# Patient Record
Sex: Male | Born: 1950 | Race: Black or African American | Hispanic: No | Marital: Single | Smoking: Never smoker
Health system: Southern US, Community
[De-identification: ages and names within clinical notes are randomized; demographics above are authoritative.]

## PROBLEM LIST (undated history)

## (undated) DIAGNOSIS — D1720 Benign lipomatous neoplasm of skin and subcutaneous tissue of unspecified limb: Secondary | ICD-10-CM

## (undated) DIAGNOSIS — N529 Male erectile dysfunction, unspecified: Secondary | ICD-10-CM

## (undated) DIAGNOSIS — R7611 Nonspecific reaction to tuberculin skin test without active tuberculosis: Secondary | ICD-10-CM

## (undated) DIAGNOSIS — H269 Unspecified cataract: Secondary | ICD-10-CM

## (undated) DIAGNOSIS — K219 Gastro-esophageal reflux disease without esophagitis: Secondary | ICD-10-CM

## (undated) DIAGNOSIS — J45909 Unspecified asthma, uncomplicated: Secondary | ICD-10-CM

## (undated) DIAGNOSIS — I10 Essential (primary) hypertension: Secondary | ICD-10-CM

## (undated) DIAGNOSIS — G4700 Insomnia, unspecified: Secondary | ICD-10-CM

## (undated) DIAGNOSIS — N400 Enlarged prostate without lower urinary tract symptoms: Secondary | ICD-10-CM

## (undated) DIAGNOSIS — M19019 Primary osteoarthritis, unspecified shoulder: Secondary | ICD-10-CM

## (undated) HISTORY — DX: Benign lipomatous neoplasm of skin and subcutaneous tissue of unspecified limb: D17.20

## (undated) HISTORY — DX: Nonspecific reaction to tuberculin skin test without active tuberculosis: R76.11

## (undated) HISTORY — DX: Benign prostatic hyperplasia without lower urinary tract symptoms: N40.0

## (undated) HISTORY — DX: Unspecified cataract: H26.9

## (undated) HISTORY — DX: Insomnia, unspecified: G47.00

## (undated) HISTORY — DX: Unspecified asthma, uncomplicated: J45.909

## (undated) HISTORY — DX: Male erectile dysfunction, unspecified: N52.9

## (undated) HISTORY — DX: Gastro-esophageal reflux disease without esophagitis: K21.9

## (undated) HISTORY — DX: Primary osteoarthritis, unspecified shoulder: M19.019

## (undated) HISTORY — DX: Essential (primary) hypertension: I10

---

## 1998-03-23 ENCOUNTER — Encounter: Admission: RE | Admit: 1998-03-23 | Discharge: 1998-06-21 | Payer: Self-pay | Admitting: Orthopedic Surgery

## 1998-04-07 ENCOUNTER — Ambulatory Visit (HOSPITAL_COMMUNITY): Admission: RE | Admit: 1998-04-07 | Discharge: 1998-04-07 | Payer: Self-pay | Admitting: *Deleted

## 1999-04-11 ENCOUNTER — Encounter: Admission: RE | Admit: 1999-04-11 | Discharge: 1999-07-10 | Payer: Self-pay | Admitting: Sports Medicine

## 1999-04-20 ENCOUNTER — Ambulatory Visit (HOSPITAL_COMMUNITY): Admission: RE | Admit: 1999-04-20 | Discharge: 1999-04-20 | Payer: Self-pay | Admitting: *Deleted

## 1999-04-27 ENCOUNTER — Ambulatory Visit (HOSPITAL_COMMUNITY): Admission: RE | Admit: 1999-04-27 | Discharge: 1999-04-27 | Payer: Self-pay | Admitting: Sports Medicine

## 1999-04-27 ENCOUNTER — Encounter: Payer: Self-pay | Admitting: Sports Medicine

## 1999-05-11 ENCOUNTER — Encounter: Payer: Self-pay | Admitting: Neurosurgery

## 1999-05-11 ENCOUNTER — Ambulatory Visit (HOSPITAL_COMMUNITY): Admission: RE | Admit: 1999-05-11 | Discharge: 1999-05-11 | Payer: Self-pay | Admitting: Neurosurgery

## 2000-03-19 ENCOUNTER — Encounter: Payer: Self-pay | Admitting: *Deleted

## 2000-03-19 ENCOUNTER — Ambulatory Visit (HOSPITAL_COMMUNITY): Admission: RE | Admit: 2000-03-19 | Discharge: 2000-03-19 | Payer: Self-pay | Admitting: *Deleted

## 2001-11-24 ENCOUNTER — Encounter: Admission: RE | Admit: 2001-11-24 | Discharge: 2001-11-24 | Payer: Self-pay | Admitting: Internal Medicine

## 2001-11-24 ENCOUNTER — Encounter: Payer: Self-pay | Admitting: Internal Medicine

## 2001-12-07 ENCOUNTER — Ambulatory Visit (HOSPITAL_COMMUNITY): Admission: RE | Admit: 2001-12-07 | Discharge: 2001-12-07 | Payer: Self-pay | Admitting: Gastroenterology

## 2003-06-08 ENCOUNTER — Encounter: Payer: Self-pay | Admitting: Internal Medicine

## 2003-06-08 ENCOUNTER — Encounter: Admission: RE | Admit: 2003-06-08 | Discharge: 2003-06-08 | Payer: Self-pay | Admitting: Internal Medicine

## 2003-06-20 ENCOUNTER — Encounter: Payer: Self-pay | Admitting: Emergency Medicine

## 2003-06-20 ENCOUNTER — Emergency Department (HOSPITAL_COMMUNITY): Admission: EM | Admit: 2003-06-20 | Discharge: 2003-06-20 | Payer: Self-pay | Admitting: Emergency Medicine

## 2003-07-03 ENCOUNTER — Encounter: Admission: RE | Admit: 2003-07-03 | Discharge: 2003-07-03 | Payer: Self-pay | Admitting: Internal Medicine

## 2003-07-03 ENCOUNTER — Encounter: Payer: Self-pay | Admitting: Internal Medicine

## 2011-01-28 ENCOUNTER — Other Ambulatory Visit: Payer: Self-pay | Admitting: Orthopedic Surgery

## 2011-01-28 DIAGNOSIS — M25512 Pain in left shoulder: Secondary | ICD-10-CM

## 2011-01-30 ENCOUNTER — Ambulatory Visit
Admission: RE | Admit: 2011-01-30 | Discharge: 2011-01-30 | Disposition: A | Discharge status: Home / Self Care | Payer: BC Managed Care – PPO | Source: Ambulatory Visit | Attending: Orthopedic Surgery | Admitting: Orthopedic Surgery

## 2011-01-30 DIAGNOSIS — M25512 Pain in left shoulder: Secondary | ICD-10-CM

## 2017-07-29 ENCOUNTER — Other Ambulatory Visit (INDEPENDENT_AMBULATORY_CARE_PROVIDER_SITE_OTHER): Payer: BC Managed Care – PPO

## 2017-07-29 ENCOUNTER — Ambulatory Visit (INDEPENDENT_AMBULATORY_CARE_PROVIDER_SITE_OTHER): Payer: BC Managed Care – PPO | Admitting: Pulmonary Disease

## 2017-07-29 ENCOUNTER — Telehealth: Payer: Self-pay | Admitting: Pulmonary Disease

## 2017-07-29 ENCOUNTER — Encounter: Payer: Self-pay | Admitting: Pulmonary Disease

## 2017-07-29 DIAGNOSIS — J189 Pneumonia, unspecified organism: Secondary | ICD-10-CM | POA: Diagnosis not present

## 2017-07-29 DIAGNOSIS — J984 Other disorders of lung: Secondary | ICD-10-CM

## 2017-07-29 DIAGNOSIS — E876 Hypokalemia: Secondary | ICD-10-CM | POA: Diagnosis not present

## 2017-07-29 LAB — CBC WITH DIFFERENTIAL/PLATELET
BASOS PCT: 0.8 % (ref 0.0–3.0)
Basophils Absolute: 0.1 10*3/uL (ref 0.0–0.1)
Eosinophils Absolute: 0.2 10*3/uL (ref 0.0–0.7)
Eosinophils Relative: 2.6 % (ref 0.0–5.0)
HEMATOCRIT: 42.1 % (ref 39.0–52.0)
Hemoglobin: 14.1 g/dL (ref 13.0–17.0)
LYMPHS ABS: 1.8 10*3/uL (ref 0.7–4.0)
Lymphocytes Relative: 26.1 % (ref 12.0–46.0)
MCHC: 33.4 g/dL (ref 30.0–36.0)
MCV: 95.9 fl (ref 78.0–100.0)
Monocytes Absolute: 0.5 10*3/uL (ref 0.1–1.0)
Monocytes Relative: 8 % (ref 3.0–12.0)
NEUTROS ABS: 4.3 10*3/uL (ref 1.4–7.7)
Neutrophils Relative %: 62.5 % (ref 43.0–77.0)
PLATELETS: 215 10*3/uL (ref 150.0–400.0)
RBC: 4.39 Mil/uL (ref 4.22–5.81)
RDW: 13.2 % (ref 11.5–15.5)
WBC: 6.8 10*3/uL (ref 4.0–10.5)

## 2017-07-29 LAB — COMPREHENSIVE METABOLIC PANEL
ALK PHOS: 55 U/L (ref 39–117)
ALT: 18 U/L (ref 0–53)
AST: 19 U/L (ref 0–37)
Albumin: 3.9 g/dL (ref 3.5–5.2)
BILIRUBIN TOTAL: 0.4 mg/dL (ref 0.2–1.2)
BUN: 19 mg/dL (ref 6–23)
CALCIUM: 8.9 mg/dL (ref 8.4–10.5)
CO2: 31 meq/L (ref 19–32)
CREATININE: 1.33 mg/dL (ref 0.40–1.50)
Chloride: 103 mEq/L (ref 96–112)
GFR: 68.7 mL/min (ref >60.00)
Glucose, Bld: 101 mg/dL — ABNORMAL HIGH (ref 70–99)
Potassium: 3.2 mEq/L — ABNORMAL LOW (ref 3.5–5.1)
Sodium: 141 mEq/L (ref 135–145)
TOTAL PROTEIN: 7.4 g/dL (ref 6.0–8.3)

## 2017-07-29 MED ORDER — PROMETHAZINE-CODEINE 6.25-10 MG/5ML PO SYRP: 5.0000 mL | ORAL_SOLUTION | Freq: Four times a day (QID) | ORAL | 0 refills | Status: AC | PRN

## 2017-07-29 MED ORDER — LEVOFLOXACIN 500 MG PO TABS: 500.0000 mg | ORAL_TABLET | Freq: Every day | ORAL | 0 refills | Status: DC

## 2017-07-29 NOTE — Patient Instructions (Signed)
You have a large fluid-filled cavity in your left upper lung This may represent a cavitary pneumonia or lung abscess  Please bring me the CD to reviewed images of the CAT scan, based on that we will decide about bronchoscopy procedure  Take Levaquin 500 milligrams daily  (prescription for 10 more tablets after you are done with current prescription)  Blood work today  Prescription for promethazine codeine cough syrup 2.5- 77ml at bedtime for 10 days -use Delsym 5 mL daily in the daytime to avoid sleepiness

## 2017-07-29 NOTE — Telephone Encounter (Signed)
Disc has been located and given to RA.

## 2017-07-29 NOTE — Assessment & Plan Note (Signed)
a large fluid-filled cavity in your left upper lung This may represent a cavitary pneumonia or lung abscess or infected pneumatocele. Interestingly CT chest in 2004 did not show any evidence of pneumatocele that location  Please bring me the CD to reviewed images of the CAT scan, based on that we will decide about bronchoscopy procedure  Take Levaquin 500 milligrams daily  (prescription for 10 more tablets after you are done with current prescription)  Blood work today  Prescription for promethazine codeine cough syrup 2.5- 40ml at bedtime for 10 days -use Delsym 5 mL daily in the daytime to avoid sleepiness

## 2017-07-29 NOTE — Progress Notes (Signed)
Subjective:    Patient ID: Phillip Tyler, male    DOB: 01-Jun-1951, 66 y.o.   MRN: [id number]  HPI  66 year old never smoker, [place] origin, IT professor at [employer] presents for evaluation of abnormal imaging studies.  He reports cough of insidious onset for about 3 weeks, no preceding URI symptoms or sick contacts. This is worse and he lies down to sleep or with cold drinks, one drink seem to help. He also reports heaviness in his left upper chest, nonpleuritic and nonradiating and nonexertional. He travel to [place] for his daughter's marriage on 7/28 and was coughing all the way back. He saw his PCP on 8/4, chest x-ray suggested large thin-walled lucent lesion involving the left lung with air fluid level. He was given Levaquin for 10 days and a prednisone dosepak was also given a codeine-containing cough syrup which is very expensive and he was unable to fill.  CT chest was performed on 8/7 which showed large left upper lobe cavitary with an air-fluid level measuring [id number] cm it was most consistent with a pneumatocele containing fluid. A small left effusion was also noted. I have personally reviewed these images after obtaining CT scan on a desk  He is a hypertensive, asthma was diagnosed 4 years ago and uses a B-12 MDI very seldom, appears to be mild intermittent no clear triggers. He is a lifetime never smoker and denies alcohol use His last visit to [place] was in December 2017. He has lived in the [place] for 35 years  Review of his blood work from 04/2017  is normal Blood work obtained today shows no leukocytosis and hypokalemia with potassium of 3.2  Past Medical History:  Diagnosis Date  . Arthritis of shoulder   . Asthma   . BPH without urinary obstruction   . Cataract   . Erectile dysfunction   . Esophageal reflux   . Hypertension   . Insomnia   . Lipoma of axilla   . Positive PPD      No past surgical history on file.    No Known  Allergies    Social History   Social History  . Marital status: Single    Spouse name: N/A  . Number of children: N/A  . Years of education: N/A   Occupational History  . Not on file.   Social History Main Topics  . Smoking status: Never Smoker  . Smokeless tobacco: Never Used  . Alcohol use No  . Drug use: No  . Sexual activity: Not on file   Other Topics Concern  . Not on file   Social History Narrative  . No narrative on file      Family History  Problem Relation Age of Onset  . Arthritis Mother        Deceased at age 24      Review of Systems   Constitutional: negative for anorexia, fevers and sweats  Eyes: negative for irritation, redness and visual disturbance  Ears, nose, mouth, throat, and face: negative for earaches, epistaxis, nasal congestion and sore throat  Respiratory: negative for  dyspnea on exertion, sputum and wheezing  Cardiovascular: negative for , dyspnea, lower extremity edema, orthopnea, palpitations and syncope  Gastrointestinal: negative for abdominal pain, constipation, diarrhea, melena, nausea and vomiting  Genitourinary:negative for dysuria, frequency and hematuria  Hematologic/lymphatic: negative for bleeding, easy bruising and lymphadenopathy  Musculoskeletal:negative for arthralgias, muscle weakness and stiff joints  Neurological: negative for coordination problems, gait problems, headaches and weakness  Endocrine:  negative for diabetic symptoms including polydipsia, polyuria and weight loss     Objective:   Physical Exam  Gen. Pleasant, well-nourished, in no distress, normal affect ENT - no lesions, no post nasal drip Neck: No JVD, no thyromegaly, no carotid bruits Lungs: no use of accessory muscles, no dullness to percussion, clear without rales or rhonchi , no egophony Cardiovascular: Rhythm regular, heart sounds  normal, no murmurs or gallops, no peripheral edema Abdomen: soft and non-tender, no hepatosplenomegaly, BS  normal. Musculoskeletal: No deformities, no cyanosis or clubbing Neuro:  alert, non focal        Assessment & Plan:

## 2017-07-30 DIAGNOSIS — E876 Hypokalemia: Secondary | ICD-10-CM | POA: Insufficient documentation

## 2017-07-30 NOTE — Assessment & Plan Note (Signed)
Replete with potassium chloride 40 mEq daily for 2 days

## 2017-07-31 ENCOUNTER — Other Ambulatory Visit: Payer: Self-pay | Admitting: *Deleted

## 2017-07-31 DIAGNOSIS — J984 Other disorders of lung: Principal | ICD-10-CM

## 2017-07-31 DIAGNOSIS — J189 Pneumonia, unspecified organism: Secondary | ICD-10-CM

## 2017-07-31 MED ORDER — LEVOFLOXACIN 500 MG PO TABS: 500.0000 mg | ORAL_TABLET | Freq: Every day | ORAL | 0 refills | Status: DC

## 2017-07-31 MED ORDER — POTASSIUM CHLORIDE ER 10 MEQ PO TBCR: 40.0000 meq | EXTENDED_RELEASE_TABLET | Freq: Every day | ORAL | 0 refills | Status: AC

## 2017-08-05 ENCOUNTER — Ambulatory Visit (INDEPENDENT_AMBULATORY_CARE_PROVIDER_SITE_OTHER)
Admission: RE | Admit: 2017-08-05 | Discharge: 2017-08-05 | Disposition: A | Discharge status: Home / Self Care | Payer: BC Managed Care – PPO | Source: Ambulatory Visit | Attending: Pulmonary Disease | Admitting: Pulmonary Disease

## 2017-08-05 ENCOUNTER — Other Ambulatory Visit: Payer: BC Managed Care – PPO

## 2017-08-05 DIAGNOSIS — J189 Pneumonia, unspecified organism: Secondary | ICD-10-CM

## 2017-08-05 DIAGNOSIS — J984 Other disorders of lung: Secondary | ICD-10-CM

## 2017-08-08 ENCOUNTER — Telehealth: Payer: Self-pay | Admitting: Pulmonary Disease

## 2017-08-08 DIAGNOSIS — J984 Other disorders of lung: Secondary | ICD-10-CM

## 2017-08-08 NOTE — Telephone Encounter (Signed)
Notes recorded by Randa Spike, CMA on 08/08/2017 at 3:39 PM EDT lmtcb x1 for pt. 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 States he has Levaquin #14 1 tablet QD at home to take and appt had already been set up with TP on 08-20-17 at 9:15am. Pt will arrive around 8:50am for CXR (STAT) prior to appt. CXR has been ordered. Nothing more needed at this time.

## 2017-08-12 ENCOUNTER — Telehealth: Payer: Self-pay | Admitting: Pulmonary Disease

## 2017-08-12 DIAGNOSIS — J984 Other disorders of lung: Secondary | ICD-10-CM

## 2017-08-12 NOTE — Telephone Encounter (Signed)
lmtcb X1 for pt  

## 2017-08-13 NOTE — Telephone Encounter (Signed)
Called and spoke with pt and he stated that he will run out of his abx on 8/27 and his appt is not until 8/29 with TP.  Pt stated that if he needs to be on the abx for longer then he will need a refill send to the [facility] on [address].  RA please advise. Thanks

## 2017-08-15 MED ORDER — LEVOFLOXACIN 500 MG PO TABS: 500.0000 mg | ORAL_TABLET | Freq: Every day | ORAL | 0 refills | Status: DC

## 2017-08-15 NOTE — Telephone Encounter (Signed)
Pt called back about medication (Levaquin), not sure if he needs to continue to take, waiting to hear back... Pt contact # [phone]...ert

## 2017-08-15 NOTE — Telephone Encounter (Signed)
Please provide another week of levaquin We will obtain chest x-ray on 8/29 prior to visit to [facility] and decide whether he needs longer antibiotics or not

## 2017-08-15 NOTE — Telephone Encounter (Signed)
Rx for 7 day course of Levaquin has been sent to preferred pharmacy. CXR has been ordered. Pt is aware and voiced his understanding. Nothing further needed.

## 2017-08-19 NOTE — Telephone Encounter (Signed)
Cxr was ordered under RA - needs to be ordered under TP This has been done

## 2017-08-19 NOTE — Addendum Note (Signed)
Addended by: Parke Poisson E on: 08/19/2017 02:32 PM   Modules accepted: Orders

## 2017-08-20 ENCOUNTER — Telehealth: Payer: Self-pay | Admitting: Adult Health

## 2017-08-20 ENCOUNTER — Encounter: Payer: Self-pay | Admitting: Adult Health

## 2017-08-20 ENCOUNTER — Ambulatory Visit (INDEPENDENT_AMBULATORY_CARE_PROVIDER_SITE_OTHER)
Admission: RE | Admit: 2017-08-20 | Discharge: 2017-08-20 | Disposition: A | Discharge status: Home / Self Care | Payer: BC Managed Care – PPO | Source: Ambulatory Visit | Attending: Adult Health | Admitting: Adult Health

## 2017-08-20 ENCOUNTER — Ambulatory Visit (INDEPENDENT_AMBULATORY_CARE_PROVIDER_SITE_OTHER): Payer: BC Managed Care – PPO | Admitting: Adult Health

## 2017-08-20 DIAGNOSIS — J984 Other disorders of lung: Secondary | ICD-10-CM

## 2017-08-20 DIAGNOSIS — J189 Pneumonia, unspecified organism: Secondary | ICD-10-CM | POA: Diagnosis not present

## 2017-08-20 NOTE — Patient Instructions (Signed)
Finished Levaquin as directed.  Take Probiotic (Align or  Digestive Advantage) daily for 1 month.  Mucinex DM Twice daily As needed  Cough /congestion .  Follow up Dr. Elsworth Soho  Or Parrett NP in 3 weeks with Chest xray .  Please contact office for sooner follow up if symptoms do not improve or worsen or seek emergency care

## 2017-08-20 NOTE — Progress Notes (Signed)
@Patient  ID: Phillip Tyler, male    DOB: [birth year], 66 y.o.   MRN: [id number]  Chief Complaint  Patient presents with  . 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 A small left effusion was also noted.  08/20/2017 Follow up : Cavitary Lung Lesion  Patient returns for a three-week follow-up. Patient was seen last visit for a pulmonary consult. Patient had a respiratory illness around 1 month ago. He was traveling to [facility] for his daughter's wedding. He returned with a cough and congestion, pleuritic pain. He was seen by his primary care physician on August 4 started on 10 days of Levaquin. Chest x-ray showed a large thin-walled lucent lesion involving the left lung with an air-fluid level. A CT chest done on 07/29/2017 showed a large left upper lobe cavitary with an air-fluid level measuring 14.5 x 6.6 x 5.7 cm. It was most consistent with a pneumatocele containing fluid.  Patient was continued on Levaquin. Currently on day 24/28.  He is starting to feel better, with less cough, fatigue and dypsnea.  Chest x-ray today shows a persistent thin-walled cavitary process but is significantly decreased fluid volume.  No Known Allergies  Immunization History  Administered Date(s) Administered  . Influenza Split 09/22/2016    Past Medical History:  Diagnosis Date  . Arthritis of shoulder   . Asthma   . BPH without urinary obstruction   . Cataract   . Erectile dysfunction   . Esophageal reflux   . Hypertension   . Insomnia   . Lipoma of axilla   .  Positive PPD     Tobacco History: History  Smoking Status  . Never Smoker  Smokeless Tobacco  . Never Used   Counseling given: Not Answered   Outpatient Encounter Prescriptions as of 08/20/2017  Medication Sig  . amitriptyline (ELAVIL) 25 MG tablet   . amLODipine-olmesartan (AZOR) 10-40 MG tablet   . famotidine (PEPCID) 20 MG tablet   . levofloxacin (LEVAQUIN) 500 MG tablet Take 1 tablet (500 mg total) by mouth daily.  . potassium chloride (K-DUR) 10 MEQ tablet Take 4 tablets (40 mEq total) by mouth daily.  . promethazine-codeine (PHENERGAN WITH CODEINE) 6.25-10 MG/5ML syrup Take 5 mLs by mouth every 6 (six) hours as needed for cough.  . tamsulosin (FLOMAX) 0.4 MG CAPS capsule   . TOVIAZ 8 MG TB24 tablet   . [DISCONTINUED] levofloxacin (LEVAQUIN) 500 MG tablet   . [DISCONTINUED] methylPREDNISolone (MEDROL DOSEPAK) 4 MG TBPK tablet    No facility-administered encounter medications on file as of 08/20/2017.      Review of Systems  Constitutional:   No  weight loss, night sweats,  Fevers, chills,  +fatigue, or  lassitude.  HEENT:   No headaches,  Difficulty swallowing,  Tooth/dental problems, or  Sore throat,                No sneezing, itching, ear ache, nasal congestion, post nasal drip,   CV:  No chest pain,  Orthopnea, PND, swelling in lower extremities, anasarca, dizziness, palpitations, syncope.   GI  No heartburn, indigestion, abdominal pain, nausea, vomiting, diarrhea, change in bowel habits, loss of appetite, bloody stools.   Resp:    No chest wall deformity  Skin: no rash or lesions.  GU: no dysuria, change in color of urine, no urgency or frequency.  No flank pain, no hematuria   MS:  No joint pain or swelling.  No decreased range of motion.  No back pain.    Physical Exam  BP 130/76 (BP Location: Left Arm, Cuff Size: Normal)   Pulse 69   Ht 5\' 7"  (1.702 m)   Wt 172 lb 9.6 oz (78.3 kg)   SpO2 99%   BMI 27.03 kg/m   GEN: A/Ox3; pleasant , NAD      HEENT:  [place]/AT,  EACs-clear, TMs-wnl, NOSE-clear, THROAT-clear, no lesions, no postnasal drip or exudate noted.   NECK:  Supple w/ fair ROM; no JVD; normal carotid impulses w/o bruits; no thyromegaly or nodules palpated; no lymphadenopathy.    RESP  Clear  P & A; w/o, wheezes/ rales/ or rhonchi. no accessory muscle use, no dullness to percussion  CARD:  RRR, no m/r/g, no peripheral edema, pulses intact, no cyanosis or clubbing.  GI:   Soft & nt; nml bowel sounds; no organomegaly or masses detected.   Musco: Warm bil, no deformities or joint swelling noted.   Neuro: alert, no focal deficits noted.    Skin: Warm, no lesions or rashes    Lab Results:  CBC  BNP No results found for: BNP  ProBNP No results found for: PROBNP  Imaging: Dg Chest 2 View  Result Date: 08/20/2017 CLINICAL DATA:  Recent episode of pneumonia. Persistent nonproductive cough. History of asthma. Nonsmoker. EXAM: CHEST  2 VIEW COMPARISON:  Chest x-ray of August 05, 2017 FINDINGS: There remains a large thin walled cavity in the right mid and lower lung. The volume of fluid has decreased. A small amount of free pleural fluid is present at the left lung base. The right lung is well-expanded and clear. The heart is top-normal in size. The pulmonary vascularity is not engorged. The bony thorax is unremarkable. IMPRESSION: Mild stable hyperinflation consistent with reactive airway disease. Persistent elongated ovoid thin walled cavitary process with decreased fluid volume. This may reflect a previously infected bleb but other cavitary processes including malignancy are not excluded. There is a small left pleural effusion. Again, chest CT scanning is recommended. These results will be called to the ordering clinician or representative by the Radiologist Assistant, and communication documented in the PACS or zVision Dashboard. Electronically Signed   By: David  Martinique M.D.   On: 08/20/2017 09:00   Dg Chest 2 View  Result  Date: 08/06/2017 CLINICAL DATA:  Chronic cough. EXAM: CHEST  2 VIEW COMPARISON:  None. FINDINGS: There is a large oblong mass in the left chest with air-fluid level, measuring 17 x 7 cm. There is blunting of the left costophrenic angle, possibly representing small left effusion or pleural thickening. Heart is mildly enlarged. Right lung is clear. No acute bony abnormality. IMPRESSION: Large oblong structure within the left chest extending from the aortic arch to near the left hemidiaphragm of unknown etiology. This could represent herniated colon, pneumatocele with air-fluid level, or infected bleb/bulla. Recommend further evaluation with chest CT with IV contrast. Electronically Signed   By: Rolm Baptise M.D.   On: 08/06/2017 08:19     Assessment & Plan:   No problem-specific Assessment & Plan notes found for this encounter.  Rexene Edison, NP 08/20/2017

## 2017-08-20 NOTE — Assessment & Plan Note (Signed)
Possible infected Pneumatocele w/ improvement on prolonged antibiotics. Will finish for total of 28 days of Levaquin .  Check CXR in 3 weeks . Will need CT chest going forward   Plan  Patient Instructions  Finished Levaquin as directed.  Take Probiotic (Align or  Digestive Advantage) daily for 1 month.  Mucinex DM Twice daily As needed  Cough /congestion .  Follow up Dr. Elsworth Soho  Or Caelynn Marshman NP in 3 weeks with Chest xray .  Please contact office for sooner follow up if symptoms do not improve or worsen or seek emergency care

## 2017-08-20 NOTE — Telephone Encounter (Signed)
Call report for CXR  IMPRESSION: Mild stable hyperinflation consistent with reactive airway disease. Persistent elongated ovoid thin walled cavitary process with decreased fluid volume. This may reflect a previously infected bleb but other cavitary processes including malignancy are not excluded. There is a small left pleural effusion.  Again, chest CT scanning is recommended.  Please advise Tammy Parrett, thanks.

## 2017-08-21 NOTE — Progress Notes (Signed)
Reviewed & agree with plan  

## 2017-09-11 ENCOUNTER — Encounter: Payer: Self-pay | Admitting: Adult Health

## 2017-09-11 ENCOUNTER — Telehealth: Payer: Self-pay | Admitting: Pulmonary Disease

## 2017-09-11 ENCOUNTER — Ambulatory Visit (INDEPENDENT_AMBULATORY_CARE_PROVIDER_SITE_OTHER)
Admission: RE | Admit: 2017-09-11 | Discharge: 2017-09-11 | Disposition: A | Discharge status: Home / Self Care | Payer: BC Managed Care – PPO | Source: Ambulatory Visit | Attending: Adult Health | Admitting: Adult Health

## 2017-09-11 ENCOUNTER — Ambulatory Visit (INDEPENDENT_AMBULATORY_CARE_PROVIDER_SITE_OTHER): Payer: BC Managed Care – PPO | Admitting: Adult Health

## 2017-09-11 VITALS — BP 132/86 | HR 56 | Ht 67.0 in | Wt 171.0 lb

## 2017-09-11 DIAGNOSIS — J984 Other disorders of lung: Secondary | ICD-10-CM

## 2017-09-11 DIAGNOSIS — J189 Pneumonia, unspecified organism: Secondary | ICD-10-CM | POA: Diagnosis not present

## 2017-09-11 NOTE — Telephone Encounter (Signed)
RA please advise if ok to double book pt with you in 2 months, or if needed can schedule next available.  Thanks.

## 2017-09-11 NOTE — Telephone Encounter (Signed)
OK to double

## 2017-09-11 NOTE — Assessment & Plan Note (Signed)
Infected pneumatocele is much improved with resolution on chest xray  Clinically improved.  Follow up in 2 months  Will discuss on return if follow up imaging (CT chest ) is indicated.

## 2017-09-11 NOTE — Patient Instructions (Signed)
Continue on current regimen.  Follow-up in 2 months with Dr. Elsworth Soho and As needed

## 2017-09-11 NOTE — Progress Notes (Signed)
@Patient  ID: Phillip Tyler, male    DOB: 1951/09/03, 66 y.o.   MRN: [id number]  Chief Complaint  Patient presents with  . 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 A small left effusion was also noted.  09/11/2017 Follow up : Cavitary PNA  Patient returns for a one-month follow-up. She was diagnosed with a pneumonia in August 2018 treated with antibiotics. Chest x-ray showed a large thin-walled lucent lesion involving the left lung. A subsequent CT chest on 07/29/2017 showed a large left upper lobe cavity with an air-fluid level measuring 14 cm. was treated with an extended course of antibiotics with Levaquin for 28 days. Patient has clinically improved. Chest x-ray showed gradual improvement. Chest x-ray today shows resolution of left lung airspace density containing fluid. Small region of scarring in the area. Patient says he is feeling better. He denies any cough, shortness of breath, wheezing, nausea, vomiting, diarrhea. He continues to work full-time. 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    No Known Allergies  Immunization History  Administered Date(s) Administered  . Influenza Split 09/22/2016    Past Medical History:  Diagnosis Date  . Arthritis of shoulder   . Asthma   . BPH without urinary obstruction   . Cataract   . Erectile dysfunction   . Esophageal reflux   . Hypertension   . Insomnia   . Lipoma of axilla   . Positive PPD     Tobacco History: History  Smoking Status  . Never Smoker    Smokeless Tobacco  . Never Used   Counseling given: Not Answered   Outpatient Encounter Prescriptions as of 09/11/2017  Medication Sig  . amitriptyline (ELAVIL) 25 MG tablet   . amLODipine-olmesartan (AZOR) 10-40 MG tablet   . famotidine (PEPCID) 20 MG tablet   . potassium chloride (K-DUR) 10 MEQ tablet Take 4 tablets (40 mEq total) by mouth daily.  . promethazine-codeine (PHENERGAN WITH CODEINE) 6.25-10 MG/5ML syrup Take 5 mLs by mouth every 6 (six) hours as needed for cough.  . tamsulosin (FLOMAX) 0.4 MG CAPS capsule   . TOVIAZ 8 MG TB24 tablet   . [DISCONTINUED] levofloxacin (LEVAQUIN) 500 MG tablet Take 1 tablet (500 mg total) by mouth daily. (Patient not taking: Reported on 09/11/2017)   No facility-administered encounter medications on file as of 09/11/2017.      Review of Systems  Constitutional:   No  weight loss, night sweats,  Fevers, chills, fatigue, or  lassitude.  HEENT:   No headaches,  Difficulty swallowing,  Tooth/dental problems, or  Sore throat,                No sneezing, itching, ear ache, nasal congestion, post nasal drip,   CV:  No chest pain,  Orthopnea, PND, swelling in lower extremities, anasarca, dizziness, palpitations, syncope.   GI  No heartburn, indigestion, abdominal pain, nausea, vomiting, diarrhea, change in bowel habits, loss of appetite, bloody stools.   Resp: No shortness of breath with exertion or at rest.  No excess mucus,  no productive cough,  No non-productive cough,  No coughing up of blood.  No change in color of mucus.  No wheezing.  No chest wall deformity  Skin: no rash or lesions.  GU: no dysuria, change in color of urine, no urgency or frequency.  No flank pain, no hematuria   MS:  No joint pain or swelling.  No decreased range of motion.  No back pain.    Physical Exam  BP 132/86 (BP Location: Left Arm, Cuff Size: Normal)   Pulse (!) 56   Ht 5\' 7"  (1.702 m)   Wt 171 lb (77.6 kg)   SpO2 100%   BMI 26.78 kg/m   GEN:  A/Ox3; pleasant , NAD, well nourished    HEENT:  [place]/AT,  EACs-clear, TMs-wnl, NOSE-clear, THROAT-clear, no lesions, no postnasal drip or exudate noted.   NECK:  Supple w/ fair ROM; no JVD; normal carotid impulses w/o bruits; no thyromegaly or nodules palpated; no lymphadenopathy.    RESP  Clear  P & A; w/o, wheezes/ rales/ or rhonchi. no accessory muscle use, no dullness to percussion  CARD:  RRR, no m/r/g, no peripheral edema, pulses intact, no cyanosis or clubbing.  GI:   Soft & nt; nml bowel sounds; no organomegaly or masses detected.   Musco: Warm bil, no deformities or joint swelling noted.   Neuro: alert, no focal deficits noted.    Skin: Warm, no lesions or rashes    Lab Results:  CBC  BMET  No results found for: BNP  ProBNP No results found for: PROBNP  Imaging: Dg Chest 2 View  Result Date: 09/11/2017 CLINICAL DATA:  Followup bronchitis, 2 months duration. EXAM: CHEST  2 VIEW COMPARISON:  08/20/2017.  08/05/2017 FINDINGS: Mild cardiomegaly again demonstrated. Chronic aortic atherosclerosis again demonstrated. Chronic scarring in the left lower lobe. Airspace which showed an air-fluid level in July has now become inapparent. No effusions. IMPRESSION: Resolution of the previously seen airspace in the left lung containing fluid. There is a small region of scarring in that area presently. Lungs appear otherwise clear. Electronically Signed   By: Nelson Chimes M.D.   On: 09/11/2017 14:52   Dg Chest 2 View  Result Date: 08/20/2017 CLINICAL DATA:  Recent episode of pneumonia. Persistent nonproductive cough. History of asthma. Nonsmoker. EXAM: CHEST  2 VIEW COMPARISON:  Chest x-ray of August 05, 2017 FINDINGS: There remains a large thin walled cavity in the right mid and lower lung. The volume of fluid has decreased. A small amount of free pleural fluid is present at the left lung base. The right lung is well-expanded and clear. The heart is top-normal in size. The pulmonary  vascularity is not engorged. The bony thorax is unremarkable. IMPRESSION: Mild stable hyperinflation consistent with reactive airway disease. Persistent elongated ovoid thin walled cavitary process with decreased fluid volume. This may reflect a previously infected bleb but other cavitary processes including malignancy are not excluded. There is a small left pleural effusion. Again, chest CT scanning is recommended. These results will be called to the ordering clinician or representative by the Radiologist Assistant, and communication documented in the PACS or zVision Dashboard. Electronically Signed   By: David  Martinique M.D.   On: 08/20/2017 09:00     Assessment & Plan:   No problem-specific Assessment & Plan notes found for this encounter.     Rexene Edison, NP 09/11/2017

## 2017-09-12 NOTE — Telephone Encounter (Signed)
Attempted to contact pt. No answer, no option to leave a message. Will try back.  

## 2017-09-15 NOTE — Telephone Encounter (Signed)
Called and spoke with pt and he stated that he would like to see RA before 2 months.  appt scheduled 10-12 at 315 and the pt is aware

## 2017-09-24 ENCOUNTER — Telehealth: Payer: Self-pay | Admitting: Pulmonary Disease

## 2017-09-24 NOTE — Telephone Encounter (Signed)
CP  The pt is wanting to get the CD back that he brought in at his last visit.  Do you have this?  Thanks

## 2017-09-26 NOTE — Telephone Encounter (Signed)
Spoke with Phillip Tyler who does have pt's disc - he would like to pick this up today.  Advised pt the disc will be up front.  Also, pt sees his PCP this coming Monday 10.8.18 and would like his office visits, cxrs and labs faxed to Nolene Ebbs, MD for this appt.  Advised pt will gladly do so.    Last 3 office notes, cxrs and labs printed and faxed to Dr Jeanie Cooks @ [phone] Disc placed up front for pt to pick up  Nothing further needed, will sign off

## 2017-10-03 ENCOUNTER — Ambulatory Visit (INDEPENDENT_AMBULATORY_CARE_PROVIDER_SITE_OTHER): Payer: BC Managed Care – PPO | Admitting: Pulmonary Disease

## 2017-10-03 ENCOUNTER — Encounter: Payer: Self-pay | Admitting: Pulmonary Disease

## 2017-10-03 DIAGNOSIS — J189 Pneumonia, unspecified organism: Secondary | ICD-10-CM

## 2017-10-03 DIAGNOSIS — J984 Other disorders of lung: Secondary | ICD-10-CM | POA: Diagnosis not present

## 2017-10-03 NOTE — Patient Instructions (Signed)
Call us for symptoms of fever, coiugh

## 2017-10-03 NOTE — Progress Notes (Signed)
   Subjective:    Patient ID: Phillip Tyler, male    DOB: Apr 20, 1951, 66 y.o.   MRN: [id number]  HPI  66 year old male never smoker, [place] origin, IT professor at [employer] seen for pulmonary consult 07/29/2017 for abnormal CT chest Lived in [place] since ~1980  Traveled last to [place] 2017 Hx of HTN and Asthma   He developed an acute illness in August and chest x-ray and CT showed thin-walled lucent cystic lesion in the left lung with air fluid level. He was treated with antibiotics for a few weeks and serial chest x-ray showed complete resolution of this lesion  His symptoms are resolved including his pleuritic chest pain and cough He denies dyspnea  His immunizations are up-to-date  Significant tests/ events reviewed  CT chest was performed on 07/29/17 which showed large left upper lobe cavitary with an air-fluid level measuring [phone] cm it was most consistent with a pneumatocele containing fluid. A small left effusion was also noted.    Review of Systems neg for any significant sore throat, dysphagia, itching, sneezing, nasal congestion or excess/ purulent secretions, fever, chills, sweats, unintended wt loss, pleuritic or exertional cp, hempoptysis, orthopnea pnd or change in chronic leg swelling. Also denies presyncope, palpitations, heartburn, abdominal pain, nausea, vomiting, diarrhea or change in bowel or urinary habits, dysuria,hematuria, rash, arthralgias, visual complaints, headache, numbness weakness or ataxia.     Objective:   Physical Exam  Gen. Pleasant, well-nourished, in no distress ENT - no thrush, no post nasal drip Neck: No JVD, no thyromegaly, no carotid bruits Lungs: no use of accessory muscles, no dullness to percussion, clear without rales or rhonchi  Cardiovascular: Rhythm regular, heart sounds  normal, no murmurs or gallops, no peripheral edema Musculoskeletal: No deformities, no cyanosis or clubbing         Assessment & Plan:

## 2017-10-03 NOTE — Assessment & Plan Note (Signed)
The seems to have been an infected pneumatocele which is completely resolved with antibiotics Doubt that repeat imaging required. He will call us for symptoms of chest pain, cough or unexplained fevers

## 2017-10-16 ENCOUNTER — Other Ambulatory Visit (HOSPITAL_COMMUNITY): Payer: Self-pay | Admitting: Internal Medicine

## 2017-10-16 ENCOUNTER — Ambulatory Visit (HOSPITAL_COMMUNITY)
Admission: RE | Admit: 2017-10-16 | Discharge: 2017-10-16 | Disposition: A | Discharge status: Home / Self Care | Payer: BC Managed Care – PPO | Source: Ambulatory Visit | Attending: Vascular Surgery | Admitting: Vascular Surgery

## 2017-10-16 DIAGNOSIS — I872 Venous insufficiency (chronic) (peripheral): Secondary | ICD-10-CM | POA: Diagnosis not present

## 2017-10-16 DIAGNOSIS — M79662 Pain in left lower leg: Secondary | ICD-10-CM | POA: Diagnosis not present

## 2018-11-24 IMAGING — DX DG CHEST 2V
2 series · 2 of 2 positions shown · non-contrast
Comparison: None.

CLINICAL DATA: Chronic cough.

EXAM:
CHEST  2 VIEW

[chest pa]
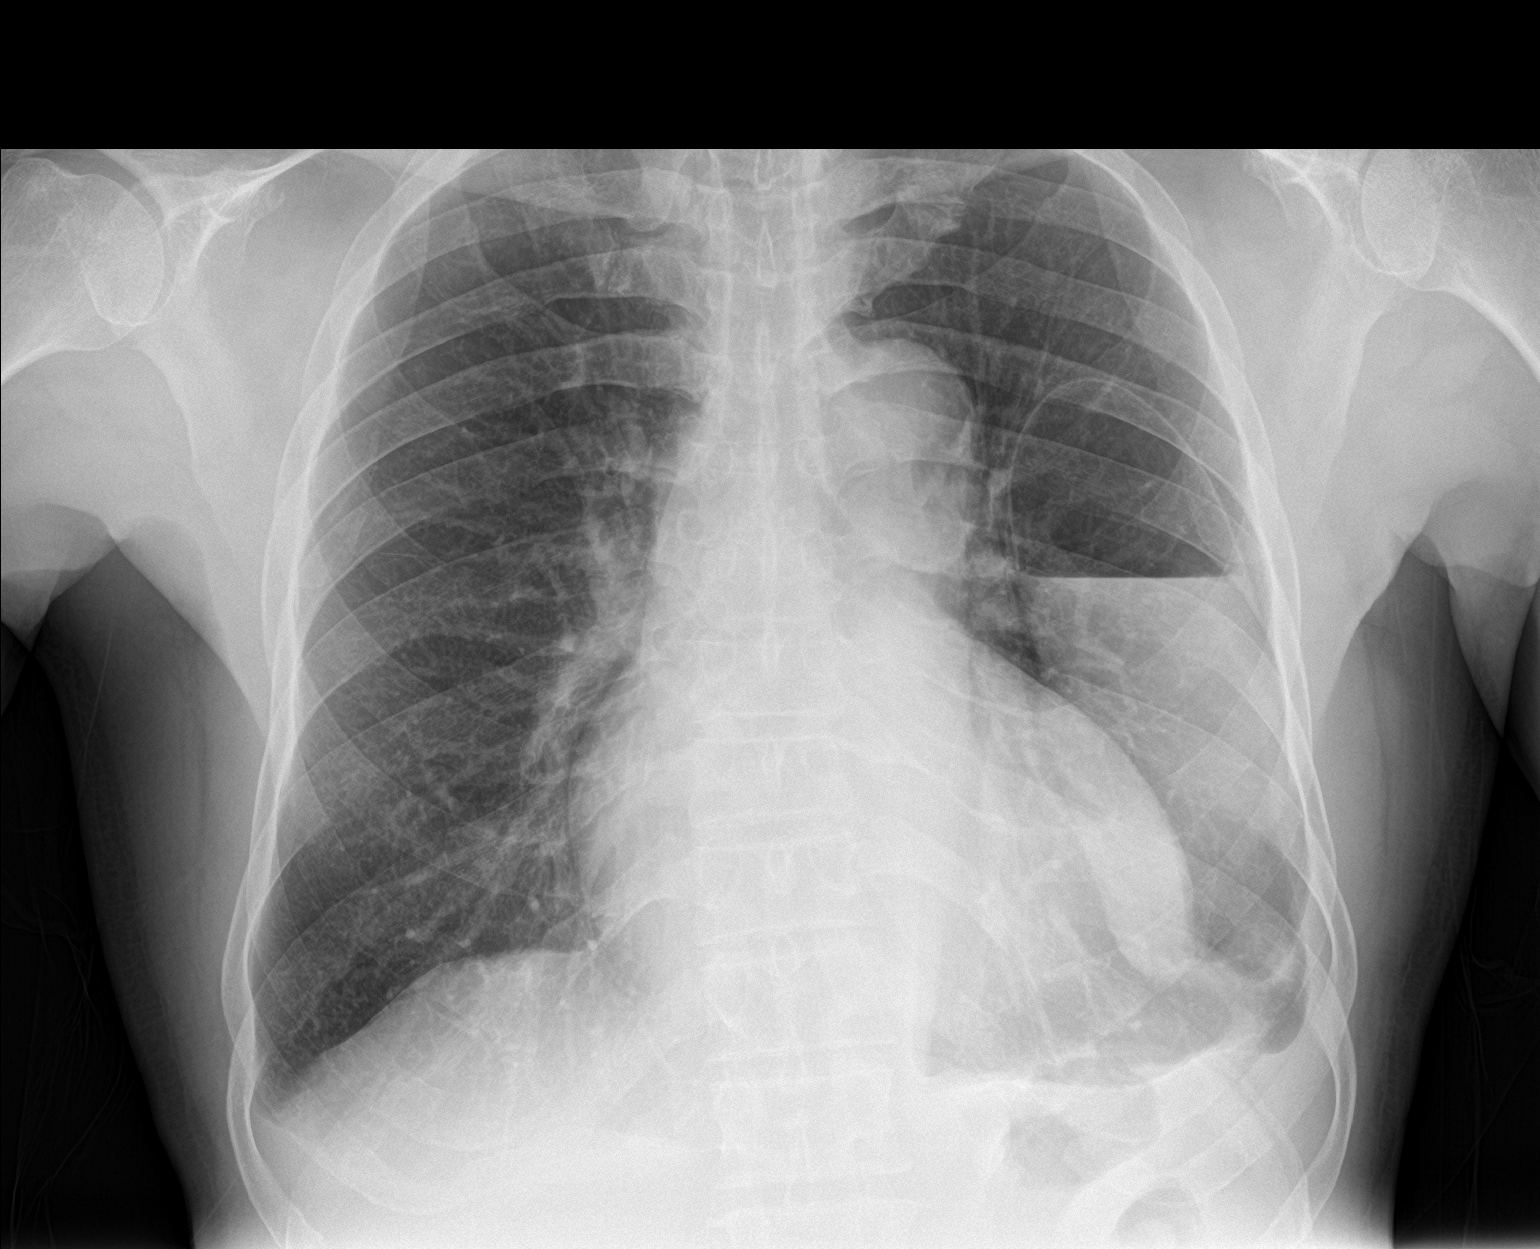

[chest lat]
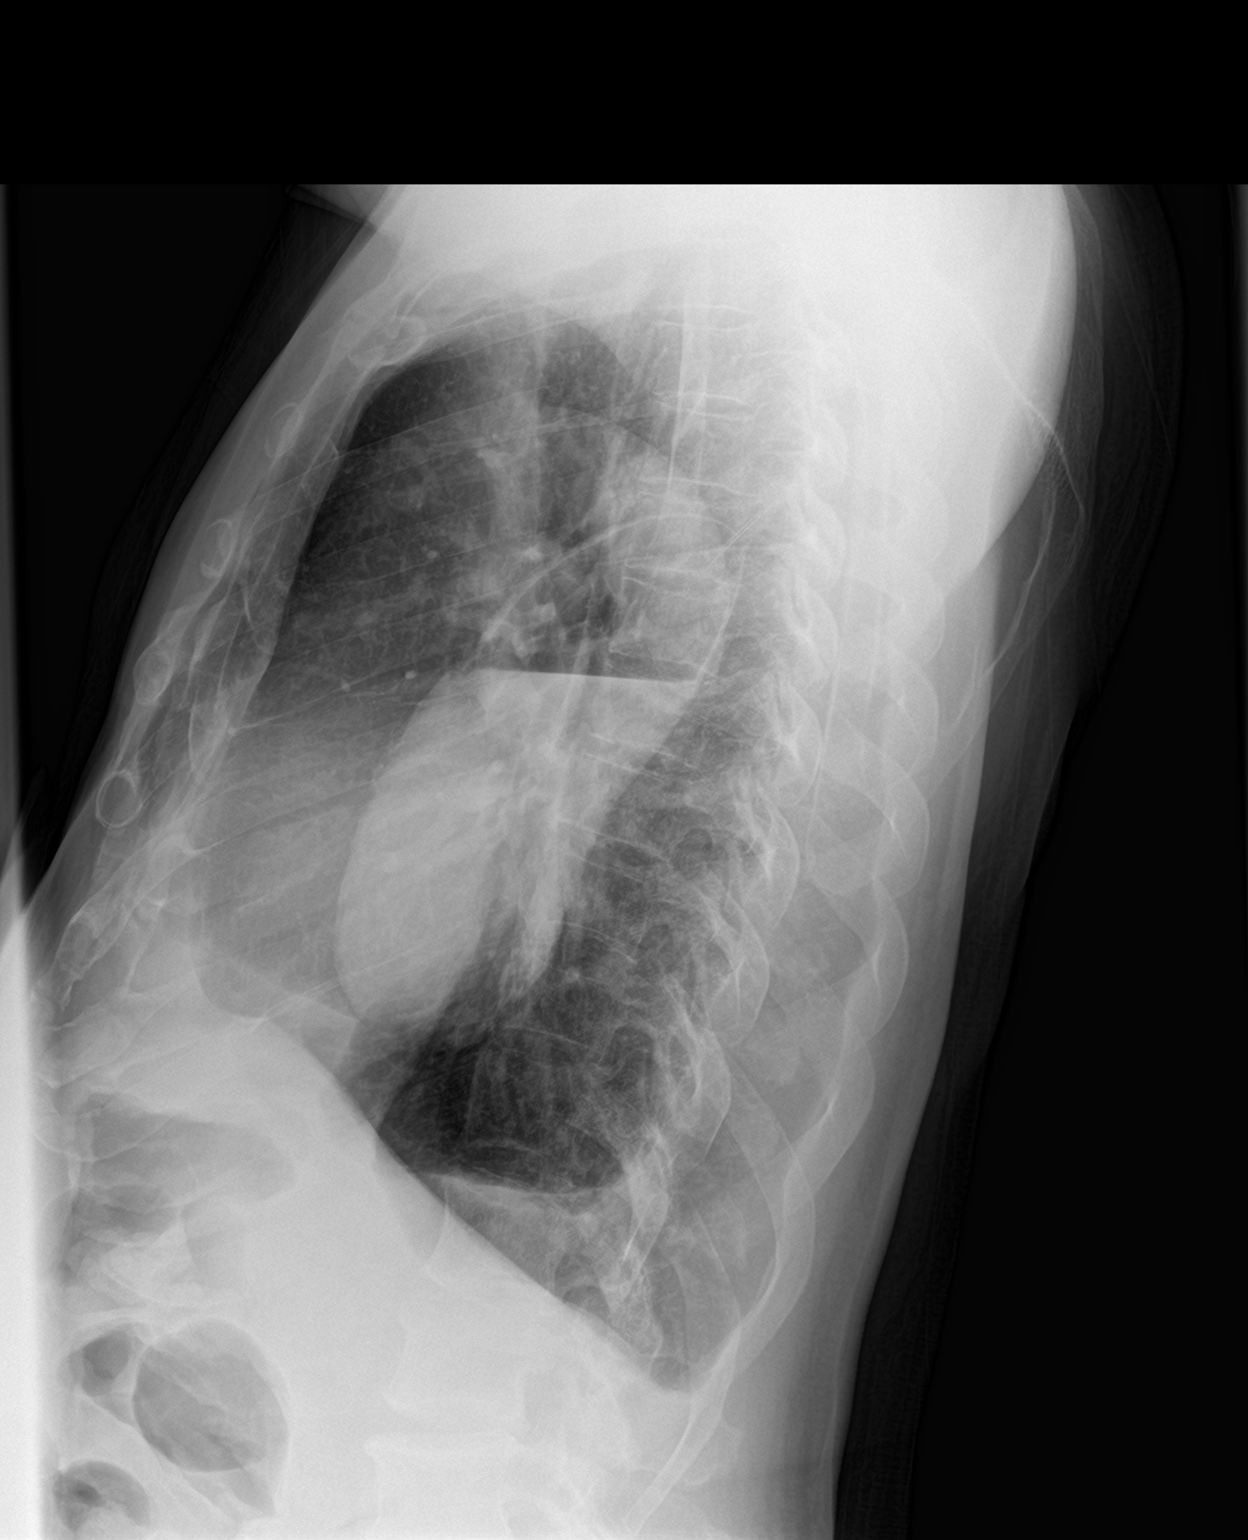

[2 of 2 positions shown; findings below may reference images not displayed]

FINDINGS: There is a large oblong mass in the left chest with air-fluid level,
measuring 17 x 7 cm. There is blunting of the left costophrenic
angle, possibly representing small left effusion or pleural
thickening. Heart is mildly enlarged. Right lung is clear. No acute
bony abnormality.
IMPRESSION: Large oblong structure within the left chest extending from the
aortic arch to near the left hemidiaphragm of unknown etiology. This
could represent herniated colon, pneumatocele with air-fluid level,
or infected bleb/bulla. Recommend further evaluation with chest CT
with IV contrast.

## 2020-02-03 ENCOUNTER — Ambulatory Visit: Payer: BC Managed Care – PPO | Attending: Family

## 2020-02-03 DIAGNOSIS — Z23 Encounter for immunization: Secondary | ICD-10-CM

## 2020-02-03 NOTE — Progress Notes (Signed)
   Covid-19 Vaccination Clinic  Name:  Phillip Tyler    MRN: [id number] DOB: January 24, 1951  02/03/2020  [name] was observed post Covid-19 immunization for 15 minutes without incidence. He was provided with Vaccine Information Sheet and instruction to access the V-Safe system.   [name] was instructed to call 911 with any severe reactions post vaccine: Marland Kitchen Difficulty breathing  . Swelling of your face and throat  . A fast heartbeat  . A bad rash all over your body  . Dizziness and weakness    Immunizations Administered    Name Date Dose VIS Date Route   Moderna COVID-19 Vaccine 02/03/2020 12:02 PM 0.5 mL 11/23/2019 Intramuscular   Manufacturer: Moderna   Lot: [id number]   [id number]

## 2020-03-07 ENCOUNTER — Ambulatory Visit: Payer: BC Managed Care – PPO | Attending: Internal Medicine

## 2020-03-07 DIAGNOSIS — Z23 Encounter for immunization: Secondary | ICD-10-CM

## 2021-07-23 ENCOUNTER — Other Ambulatory Visit: Payer: Self-pay | Admitting: Internal Medicine

## 2021-07-24 LAB — URINALYSIS W MICROSCOPIC + REFLEX CULTURE

## 2021-07-24 LAB — LIPID PANEL
Cholesterol: 185 mg/dL (ref <200)
HDL: 86 mg/dL (ref >=40)
LDL Cholesterol (Calc): 85 mg/dL (calc)
Non-HDL Cholesterol (Calc): 99 mg/dL (calc) (ref <130)
Total CHOL/HDL Ratio: 2.2 (calc) (ref <5.0)
Triglycerides: 63 mg/dL (ref <150)

## 2021-07-24 LAB — COMPLETE METABOLIC PANEL WITH GFR
AG Ratio: 1.2 (calc) (ref 1.0–2.5)
ALT: 13 U/L (ref 9–46)
AST: 18 U/L (ref 10–35)
Albumin: 3.6 g/dL (ref 3.6–5.1)
Alkaline phosphatase (APISO): 56 U/L (ref 35–144)
BUN/Creatinine Ratio: 9 (calc) (ref 6–22)
BUN: 15 mg/dL (ref 7–25)
CO2: 28 mmol/L (ref 20–32)
Calcium: 8.6 mg/dL (ref 8.6–10.3)
Chloride: 105 mmol/L (ref 98–110)
Creat: 1.58 mg/dL — ABNORMAL HIGH (ref 0.70–1.28)
Globulin: 2.9 g/dL (calc) (ref 1.9–3.7)
Glucose, Bld: 76 mg/dL (ref 65–99)
Potassium: 3.6 mmol/L (ref 3.5–5.3)
Sodium: 143 mmol/L (ref 135–146)
Total Bilirubin: 0.4 mg/dL (ref 0.2–1.2)
Total Protein: 6.5 g/dL (ref 6.1–8.1)
eGFR: 46 mL/min/{1.73_m2} — ABNORMAL LOW (ref >=60)

## 2021-07-24 LAB — PSA: PSA: 2.3 ng/mL (ref <=4.00)

## 2021-07-24 LAB — CBC
HCT: 40.6 % (ref 38.5–50.0)
Hemoglobin: 13.4 g/dL (ref 13.2–17.1)
MCH: 31.1 pg (ref 27.0–33.0)
MCHC: 33 g/dL (ref 32.0–36.0)
MCV: 94.2 fL (ref 80.0–100.0)
MPV: 9.8 fL (ref 7.5–12.5)
Platelets: 209 10*3/uL (ref 140–400)
RBC: 4.31 10*6/uL (ref 4.20–5.80)
RDW: 12 % (ref 11.0–15.0)
WBC: 3.9 10*3/uL (ref 3.8–10.8)

## 2021-07-24 LAB — TSH: TSH: 1.08 mIU/L (ref 0.40–4.50)

## 2021-09-10 ENCOUNTER — Ambulatory Visit (HOSPITAL_COMMUNITY)
Admission: EM | Admit: 2021-09-10 | Discharge: 2021-09-10 | Disposition: A | Discharge status: Home / Self Care | Payer: BC Managed Care – PPO | Attending: Sports Medicine | Admitting: Sports Medicine

## 2021-09-10 ENCOUNTER — Encounter (HOSPITAL_COMMUNITY): Payer: Self-pay | Admitting: Emergency Medicine

## 2021-09-10 ENCOUNTER — Other Ambulatory Visit: Payer: Self-pay

## 2021-09-10 DIAGNOSIS — H1031 Unspecified acute conjunctivitis, right eye: Secondary | ICD-10-CM

## 2021-09-10 MED ORDER — CYCLOSPORINE 0.05 % OP EMUL: 1.0000 [drp] | Freq: Two times a day (BID) | OPHTHALMIC | 0 refills | Status: AC

## 2021-09-10 NOTE — ED Provider Notes (Signed)
[facility]    CSN: [id number] Arrival date & time: 09/10/21  1602      History   Chief Complaint Chief Complaint  Patient presents with   Conjunctivitis    HPI Phillip Tyler is a pleasant 70 y.o. male who presents for right eye irritation.   Conjunctivitis Pertinent negatives include no headaches.   Patient states that he was at the dentist earlier today, and they noticed his right eye was red on the inferior margin.  The patient looked in the mirror and also noticed that he had some irritation of the right eye.  He states he has no pain, there is maybe some mild itching of the right eye only.  He denies any trauma or inciting event. He denies anything into the eye; denies any foreign body sensation. He denies any forceful sneeze over the last few days.  He denies any blurry vision, loss of vision or double vision.  There has been no discharge from the eye.  He does report a history of seasonal allergies, has been having to clear his throat recently, but nothing more than usual during this time of year.  Denies any of that same symptoms in the contralateral eye.  He denies any pain when looking in different directions.  Otherwise he feels well, without any fever or chills.  He denies any other symptoms of URI illness such as runny/stuffy nose, sore throat, or ear pain.  Past Medical History:  Diagnosis Date   Arthritis of shoulder    Asthma    BPH without urinary obstruction    Cataract    Erectile dysfunction    Esophageal reflux    Hypertension    Insomnia    Lipoma of axilla    Positive PPD     Patient Active Problem List   Diagnosis Date Noted   Hypokalemia 07/30/2017   Cavitary pneumonia 07/29/2017    History reviewed. No pertinent surgical history.     Home Medications    Prior to Admission medications   Medication Sig Start Date End Date Taking? Authorizing Provider  cycloSPORINE (RESTASIS) 0.05 % ophthalmic emulsion Place 1 drop into the  right eye 2 (two) times daily for 10 days. 09/10/21 09/20/21 Yes Elba Barman, DO  amitriptyline (ELAVIL) 25 MG tablet  05/21/17   [provider]  amLODipine-olmesartan (AZOR) 10-40 MG tablet  07/10/17   [provider]  diclofenac (VOLTAREN) 75 MG EC tablet  09/29/17   [provider]  famotidine (PEPCID) 20 MG tablet  06/16/17   [provider]  potassium chloride (K-DUR) 10 MEQ tablet Take 4 tablets (40 mEq total) by mouth daily. 07/31/17   Rigoberto Noel, MD  promethazine-codeine (PHENERGAN WITH CODEINE) 6.25-10 MG/5ML syrup Take 5 mLs by mouth every 6 (six) hours as needed for cough. 07/29/17   Rigoberto Noel, MD  tamsulosin [facility]) 0.4 MG CAPS capsule  07/29/17   [provider]  tiZANidine (ZANAFLEX) 4 MG tablet  09/29/17   [provider]  TOVIAZ 8 MG TB24 tablet  06/16/17   [provider]    Family History Family History  Problem Relation Age of Onset   Arthritis Mother        Deceased at age 45    Social History Social History   Tobacco Use   Smoking status: Never   Smokeless tobacco: Never  Substance Use Topics   Alcohol use: No   Drug use: No     Allergies  Patient has no known allergies.   Review of Systems Review of Systems  Constitutional:  Negative for activity change, chills and fever.  HENT:  Negative for congestion, sinus pressure and sneezing.   Eyes:  Positive for redness. Negative for photophobia, pain, discharge and itching.  Respiratory:  Negative for cough.   Neurological:  Negative for headaches.    Physical Exam Triage Vital Signs ED Triage Vitals [09/10/21 1723]  Enc Vitals Group     BP (!) 152/80     Pulse Rate (!) 50     Resp 17     Temp 98.4 F (36.9 C)     Temp src      SpO2 95 %     Weight      Height      Head Circumference      Peak Flow      Pain Score 0     Pain Loc      Pain Edu?      Excl. in [place]?    No data found.  Updated Vital Signs BP (!) 152/80 (BP  Location: Left Arm)   Pulse (!) 50   Temp 98.4 F (36.9 C)   Resp 17   SpO2 95%    Physical Exam Constitutional:      General: He is not in acute distress.    Appearance: Normal appearance. He is not ill-appearing.  HENT:     Head: Normocephalic and atraumatic.     Right Ear: Ear canal and external ear normal.     Left Ear: Ear canal and external ear normal.     Nose: Nose normal. No congestion.     Comments: + slightly erythematous, boggy nasal turbinates b/l    Mouth/Throat:     Mouth: Mucous membranes are moist.     Pharynx: No oropharyngeal exudate or posterior oropharyngeal erythema.  Eyes:     General: Lids are normal.        Right eye: No discharge.        Left eye: No discharge.     Extraocular Movements: Extraocular movements intact.     Right eye: Normal extraocular motion.     Left eye: Normal extraocular motion.     Conjunctiva/sclera:     Right eye: Right conjunctiva is injected. No exudate.    Left eye: Left conjunctiva is not injected. No exudate.    Pupils: Pupils are equal, round, and reactive to light.      Comments: Visual field testing in all 4 quadrants with R, L, and b/l eyes tested separately with intact vision without abnormalities  Cardiovascular:     Rate and Rhythm: Normal rate.  Pulmonary:     Effort: Pulmonary effort is normal. No respiratory distress.  Neurological:     Mental Status: He is alert.      UC Treatments / Results  Labs (all labs ordered are listed, but only abnormal results are displayed) Labs Reviewed - No data to display  EKG   Radiology No results found.  Procedures Procedures (including critical care time)  Medications Ordered in UC Medications - No data to display  Initial Impression / Assessment and Plan / UC Course  I have reviewed the triage vital signs and the nursing notes.  Pertinent labs & imaging results that were available during my care of the patient were reviewed by me and considered in my  medical decision making (see chart for details).     Right eye conjunctivitis - unclear  etiology. Could possibly be allergic in nature given history of allergies and boggy nasal turbinates, but unusual that it is only one eye. Possibly dry eye that was exacerbated by rubbing. Mild pruritis. Visual field and vision intact and no other warning signs. Will lubricate eye and apply restasis drops BID x 10 days. Discussed condition will likely be self-limiting.  Discussed strict return precautions for the patient such as worsening erythema, any eye pain, any changes in vision or blurry/double vision.  If this occurs, needs to return here urgently to the urgent care or the ED.  Otherwise, may follow-up with PCP. Patient is agreeable and understanding. Safe for discharge home Final Clinical Impressions(s) / UC Diagnoses   Final diagnoses:  Acute conjunctivitis of right eye, unspecified acute conjunctivitis type     Discharge Instructions      Keep eyes lubricated; avoid rubbing your eyes   Apply restasis drops into right eye twice a day for 10 days (may apply in left eye if it becomes red as well)  If the eye becomes painful or you start having any vision loss, you need to return to urgent care and/or ED      ED Prescriptions     Medication Sig Dispense Auth. Provider   cycloSPORINE (RESTASIS) 0.05 % ophthalmic emulsion Place 1 drop into the right eye 2 (two) times daily for 10 days. 3 each Elba Barman, DO      PDMP not reviewed this encounter.   Elba Barman, DO 09/10/21 1805

## 2021-09-10 NOTE — Discharge Instructions (Addendum)
Keep eyes lubricated; avoid rubbing your eyes   Apply restasis drops into right eye twice a day for 10 days (may apply in left eye if it becomes red as well)  If the eye becomes painful or you start having any vision loss, you need to return to urgent care and/or ED

## 2021-09-10 NOTE — ED Triage Notes (Signed)
Pt reports when went to dentist earlier they were questioning right eye being red. Showed patient in mirror. Reports little itching and being watery. Denies blurred vision.

## 2021-12-15 ENCOUNTER — Other Ambulatory Visit: Payer: Self-pay

## 2021-12-15 ENCOUNTER — Emergency Department (HOSPITAL_COMMUNITY)
Admission: EM | Admit: 2021-12-15 | Discharge: 2021-12-15 | Disposition: A | Discharge status: Home / Self Care | Payer: BC Managed Care – PPO | Attending: Emergency Medicine | Admitting: Emergency Medicine

## 2021-12-15 ENCOUNTER — Encounter (HOSPITAL_COMMUNITY): Payer: Self-pay | Admitting: Emergency Medicine

## 2021-12-15 DIAGNOSIS — Z79899 Other long term (current) drug therapy: Secondary | ICD-10-CM | POA: Insufficient documentation

## 2021-12-15 DIAGNOSIS — J45909 Unspecified asthma, uncomplicated: Secondary | ICD-10-CM | POA: Diagnosis not present

## 2021-12-15 DIAGNOSIS — I10 Essential (primary) hypertension: Secondary | ICD-10-CM | POA: Insufficient documentation

## 2021-12-15 DIAGNOSIS — S6992XA Unspecified injury of left wrist, hand and finger(s), initial encounter: Secondary | ICD-10-CM | POA: Diagnosis present

## 2021-12-15 DIAGNOSIS — W260XXA Contact with knife, initial encounter: Secondary | ICD-10-CM | POA: Insufficient documentation

## 2021-12-15 DIAGNOSIS — Z23 Encounter for immunization: Secondary | ICD-10-CM | POA: Insufficient documentation

## 2021-12-15 DIAGNOSIS — S61325A Laceration with foreign body of left ring finger with damage to nail, initial encounter: Secondary | ICD-10-CM | POA: Insufficient documentation

## 2021-12-15 DIAGNOSIS — S61315A Laceration without foreign body of left ring finger with damage to nail, initial encounter: Secondary | ICD-10-CM

## 2021-12-15 MED ORDER — TETANUS-DIPHTH-ACELL PERTUSSIS 5-2.5-18.5 LF-MCG/0.5 IM SUSY
0.5000 mL | PREFILLED_SYRINGE | Freq: Once | INTRAMUSCULAR | Status: AC
  Administered 2021-12-15: 18:00:00 0.5 mL via INTRAMUSCULAR
  Filled 2021-12-15: qty 0.5

## 2021-12-15 MED ORDER — CEPHALEXIN 500 MG PO CAPS: 500.0000 mg | ORAL_CAPSULE | Freq: Four times a day (QID) | ORAL | 0 refills | Status: AC

## 2021-12-15 NOTE — ED Triage Notes (Signed)
Pt reports cutting his left ring finger 2 days ago with a knife. Bleeding controlled at this time.

## 2021-12-15 NOTE — Discharge Instructions (Addendum)
You may have diarrhea from the antibiotics.  It is very important that you continue to take the antibiotics even if you get diarrhea unless a medical professional tells you that you may stop taking them.  If you stop too early the bacteria you are being treated for will become stronger and you may need different, more powerful antibiotics that have more side effects and worsening diarrhea.  Please stay well hydrated and consider probiotics as they may decrease the severity of your diarrhea.   If you develop fevers, significant worsening of pain, swelling, any red streaking up the finger into the hand, develop swelling in the hand or have any new or concerning symptoms please do not hesitate to seek additional medical care and evaluation.  Please wash the wound with plain water and gentle soap as needed. Please do not soak or submerge your finger as this increases your risk of infection.  While in the ED your blood pressure was high.  Please follow up with your primary care doctor or the wellness clinic for repeat evaluation as you may need medication.  High blood pressure can cause long term, potentially serious, damage if left untreated.

## 2021-12-15 NOTE — ED Provider Notes (Signed)
Emergency Medicine Provider Triage Evaluation Note  Phillip Tyler , a 70 y.o. male  was evaluated in triage.  Pt complains of laceration to his left ring finger.  He states that 2 days ago he was in the kitchen cutting fish when he accidentally sliced his finger.  Daughter is a Marine scientist at bedside who states that she was cleaning it and wrapping it hoping to let it heal on its own.  Patient has a laceration to the distal left ring finger that involves minimal portion of the nail.  Subcutaneous tissue visible.  Pulse intact.  Sensation intact.  Unknown last tetanus.  Review of Systems  Positive: See above Negative:   Physical Exam  BP (!) 149/89 (BP Location: Left Arm)    Pulse 63    Temp 97.6 F (36.4 C) (Oral)    Resp 14    SpO2 98%  Gen:   Awake, no distress   Resp:  Normal effort  MSK:   Moves extremities without difficulty  Other:  Laceration to the left ring finger, hemostatic.  Sensation intact.  Pulses intact.  Medical Decision Making  Medically screening exam initiated at 4:50 PM.  Appropriate orders placed.  [name] was informed that the remainder of the evaluation will be completed by another provider, this initial triage assessment does not replace that evaluation, and the importance of remaining in the ED until their evaluation is complete.     Mickie Hillier, PA-C 12/15/21 1654    Wyvonnia Dusky, MD 12/15/21 1728

## 2021-12-15 NOTE — ED Provider Notes (Signed)
[facility] Provider Note   CSN: [id number] Arrival date & time: 12/15/21  1634     History Chief Complaint  Patient presents with   Laceration    Phillip Tyler is a 70 y.o. male with a past medical history of hypertension, GERD, who presents today for evaluation of a finger laceration. History obtained from patient, family at bedside.  Patient was cutting fish and accidentally cut his left ring finger on a knife.  This happened 2 days ago.  Unknown last tetanus shot.  He came in today as it is still intermittently oozing.  He has been treating it with iodine at home.  No fevers.  No numbness.    He and family at bedside state that they now want stitches in it to get it to stop oozing.  HPI     Past Medical History:  Diagnosis Date   Arthritis of shoulder    Asthma    BPH without urinary obstruction    Cataract    Erectile dysfunction    Esophageal reflux    Hypertension    Insomnia    Lipoma of axilla    Positive PPD     Patient Active Problem List   Diagnosis Date Noted   Hypokalemia 07/30/2017   Cavitary pneumonia 07/29/2017    History reviewed. No pertinent surgical history.     Family History  Problem Relation Age of Onset   Arthritis Mother        Deceased at age 54    Social History   Tobacco Use   Smoking status: Never   Smokeless tobacco: Never  Substance Use Topics   Alcohol use: No   Drug use: No    Home Medications Prior to Admission medications   Medication Sig Start Date End Date Taking? Authorizing Provider  cephALEXin (KEFLEX) 500 MG capsule Take 1 capsule (500 mg total) by mouth 4 (four) times daily for 7 days. 12/15/21 12/22/21 Yes Lorin Glass, PA-C  amitriptyline (ELAVIL) 25 MG tablet  05/21/17   [provider]  amLODipine-olmesartan (AZOR) 10-40 MG tablet  07/10/17   [provider]  diclofenac (VOLTAREN) 75 MG EC tablet  09/29/17   [provider]   famotidine (PEPCID) 20 MG tablet  06/16/17   [provider]  potassium chloride (K-DUR) 10 MEQ tablet Take 4 tablets (40 mEq total) by mouth daily. 07/31/17   Rigoberto Noel, MD  promethazine-codeine (PHENERGAN WITH CODEINE) 6.25-10 MG/5ML syrup Take 5 mLs by mouth every 6 (six) hours as needed for cough. 07/29/17   Rigoberto Noel, MD  tamsulosin (FLOMAX) 0.4 MG CAPS capsule  07/29/17   [provider]  tiZANidine (ZANAFLEX) 4 MG tablet  09/29/17   [provider]  TOVIAZ 8 MG TB24 tablet  06/16/17   [provider]    Allergies    Patient has no known allergies.  Review of Systems   Review of Systems  Constitutional:  Negative for chills and fever.  Skin:  Positive for wound.  All other systems reviewed and are negative.  Physical Exam Updated Vital Signs BP (!) 149/89 (BP Location: Left Arm)    Pulse 63    Temp 97.6 F (36.4 C) (Oral)    Resp 14    SpO2 98%   Physical Exam Vitals and nursing note reviewed.  Constitutional:      General: He is not in acute distress. HENT:     Head: Normocephalic and atraumatic.  Cardiovascular:     Rate and Rhythm: Normal rate.     Comments: Capillary refill on left ring finger distal to laceration is under 2 seconds, appears warm and well-perfused. Pulmonary:     Effort: Pulmonary effort is normal. No respiratory distress.  Musculoskeletal:     Cervical back: No rigidity.     Comments: Patient has full active range of motion of the left ring finger.  Skin:    Comments: Please see clinical images.  There is a laceration over the left ring finger.  The laceration is C shaped.  The flap is well adhered and does not easily move.  Laceration is through part of the nail.  No active bleeding at this time.    Neurological:     Mental Status: He is alert. Mental status is at baseline.     Sensory: No sensory deficit (Sensation intact to distal left ring finger.).     Comments: Awake and alert, answers all questions  appropriately.  Speech is not slurred.    Psychiatric:        Mood and Affect: Mood normal.             ED Results / Procedures / Treatments   Labs (all labs ordered are listed, but only abnormal results are displayed) Labs Reviewed - No data to display  EKG None  Radiology No results found.  Procedures Procedures   Medications Ordered in ED Medications  Tdap (BOOSTRIX) injection 0.5 mL (0.5 mLs Intramuscular Given 12/15/21 1822)    ED Course  I have reviewed the triage vital signs and the nursing notes.  Pertinent labs & imaging results that were available during my care of the patient were reviewed by me and considered in my medical decision making (see chart for details).    MDM Rules/Calculators/A&P                          Patient is a 70 year old man who presents today for evaluation of a laceration to his left ring finger.  This occurred 2 days ago.  They presented today as it is continuing to ooze. Due to significant delay between laceration and presentation into the emergency room I discussed with patient and family that if stitches were to be placed there would be a very high risk of infection. The flap is already well adhered to underlying tissue even though it is not well aligned.  There is no abnormal fluctuance or induration suggestive of a deep infection.   Will update tetanus.  We will place him on Keflex for infection prevention.  Discussed wound care with patient and family.  He is given hand surgery follow-up.  Return precautions were discussed with patient who states their understanding.  At the time of discharge patient denied any unaddressed complaints or concerns.  Patient is agreeable for discharge home.  Note: Portions of this report may have been transcribed using voice recognition software. Every effort was made to ensure accuracy; however, inadvertent computerized transcription errors may be present    Final Clinical Impression(s) /  ED Diagnoses Final diagnoses:  Laceration of left ring finger with damage to nail, foreign body presence unspecified, initial encounter    Rx / DC Orders ED Discharge Orders          Ordered    cephALEXin (KEFLEX) 500 MG capsule  4 times daily        12/15/21 1907  Lorin Glass, PA-C 12/15/21 Docia Chuck    Varney Biles, MD 12/16/21 [phone]

## 2022-02-05 ENCOUNTER — Institutional Professional Consult (permissible substitution): Payer: BC Managed Care – PPO | Admitting: Pulmonary Disease

## 2022-08-06 ENCOUNTER — Other Ambulatory Visit: Payer: Self-pay | Admitting: Internal Medicine

## 2022-08-07 LAB — CBC
HCT: 38.2 % — ABNORMAL LOW (ref 38.5–50.0)
Hemoglobin: 12.6 g/dL — ABNORMAL LOW (ref 13.2–17.1)
MCH: 30.1 pg (ref 27.0–33.0)
MCHC: 33 g/dL (ref 32.0–36.0)
MCV: 91.4 fL (ref 80.0–100.0)
MPV: 9.9 fL (ref 7.5–12.5)
Platelets: 195 10*3/uL (ref 140–400)
RBC: 4.18 10*6/uL — ABNORMAL LOW (ref 4.20–5.80)
RDW: 12.2 % (ref 11.0–15.0)
WBC: 4 10*3/uL (ref 3.8–10.8)

## 2022-08-07 LAB — COMPLETE METABOLIC PANEL WITH GFR
AG Ratio: 1.3 (calc) (ref 1.0–2.5)
ALT: 9 U/L (ref 9–46)
AST: 17 U/L (ref 10–35)
Albumin: 3.8 g/dL (ref 3.6–5.1)
Alkaline phosphatase (APISO): 67 U/L (ref 35–144)
BUN/Creatinine Ratio: 9 (calc) (ref 6–22)
BUN: 16 mg/dL (ref 7–25)
CO2: 26 mmol/L (ref 20–32)
Calcium: 8.7 mg/dL (ref 8.6–10.3)
Chloride: 106 mmol/L (ref 98–110)
Creat: 1.71 mg/dL — ABNORMAL HIGH (ref 0.70–1.28)
Globulin: 2.9 g/dL (calc) (ref 1.9–3.7)
Glucose, Bld: 77 mg/dL (ref 65–99)
Potassium: 3.5 mmol/L (ref 3.5–5.3)
Sodium: 143 mmol/L (ref 135–146)
Total Bilirubin: 0.4 mg/dL (ref 0.2–1.2)
Total Protein: 6.7 g/dL (ref 6.1–8.1)
eGFR: 42 mL/min/{1.73_m2} — ABNORMAL LOW (ref >=60)

## 2022-08-07 LAB — PSA: PSA: 3.38 ng/mL (ref <=4.00)

## 2022-08-07 LAB — TSH: TSH: 1.27 m[IU]/L (ref 0.40–4.50)

## 2022-08-07 LAB — LIPID PANEL
Cholesterol: 183 mg/dL (ref <200)
HDL: 85 mg/dL (ref >=40)
LDL Cholesterol (Calc): 83 mg/dL (calc)
Non-HDL Cholesterol (Calc): 98 mg/dL (calc) (ref <130)
Total CHOL/HDL Ratio: 2.2 (calc) (ref <5.0)
Triglycerides: 74 mg/dL (ref <150)

## 2023-08-13 ENCOUNTER — Other Ambulatory Visit: Payer: Self-pay | Admitting: Internal Medicine

## 2023-08-14 LAB — COMPLETE METABOLIC PANEL WITH GFR
AG Ratio: 1.2 (calc) (ref 1.0–2.5)
ALT: 14 U/L (ref 9–46)
AST: 20 U/L (ref 10–35)
Albumin: 3.8 g/dL (ref 3.6–5.1)
Alkaline phosphatase (APISO): 57 U/L (ref 35–144)
BUN/Creatinine Ratio: 9 (calc) (ref 6–22)
BUN: 14 mg/dL (ref 7–25)
CO2: 30 mmol/L (ref 20–32)
Calcium: 9 mg/dL (ref 8.6–10.3)
Chloride: 104 mmol/L (ref 98–110)
Creat: 1.59 mg/dL — ABNORMAL HIGH (ref 0.70–1.28)
Globulin: 3.1 g/dL (ref 1.9–3.7)
Glucose, Bld: 109 mg/dL — ABNORMAL HIGH (ref 65–99)
Potassium: 3.6 mmol/L (ref 3.5–5.3)
Sodium: 144 mmol/L (ref 135–146)
Total Bilirubin: 0.4 mg/dL (ref 0.2–1.2)
Total Protein: 6.9 g/dL (ref 6.1–8.1)
eGFR: 45 mL/min/{1.73_m2} — ABNORMAL LOW (ref >=60)

## 2023-08-14 LAB — CBC
HCT: 37 % — ABNORMAL LOW (ref 38.5–50.0)
Hemoglobin: 12 g/dL — ABNORMAL LOW (ref 13.2–17.1)
MCH: 28.9 pg (ref 27.0–33.0)
MCHC: 32.4 g/dL (ref 32.0–36.0)
MCV: 89.2 fL (ref 80.0–100.0)
MPV: 10.1 fL (ref 7.5–12.5)
Platelets: 200 10*3/uL (ref 140–400)
RBC: 4.15 10*6/uL — ABNORMAL LOW (ref 4.20–5.80)
RDW: 12.6 % (ref 11.0–15.0)
WBC: 5.1 10*3/uL (ref 3.8–10.8)

## 2023-08-14 LAB — PSA: PSA: 4.11 ng/mL — ABNORMAL HIGH (ref <=4.00)

## 2023-08-14 LAB — LIPID PANEL
Cholesterol: 174 mg/dL (ref <200)
HDL: 81 mg/dL (ref >=40)
LDL Cholesterol (Calc): 73 mg/dL
Non-HDL Cholesterol (Calc): 93 mg/dL (ref <130)
Total CHOL/HDL Ratio: 2.1 (calc) (ref <5.0)
Triglycerides: 110 mg/dL (ref <150)

## 2023-08-14 LAB — TSH: TSH: 1.36 m[IU]/L (ref 0.40–4.50)

## 2023-08-14 LAB — URIC ACID: Uric Acid, Serum: 7.9 mg/dL (ref 4.0–8.0)

## 2023-08-14 LAB — VITAMIN D 25 HYDROXY (VIT D DEFICIENCY, FRACTURES): Vit D, 25-Hydroxy: 25 ng/mL — ABNORMAL LOW (ref 30–100)

## 2024-11-22 ENCOUNTER — Other Ambulatory Visit: Payer: Self-pay | Admitting: Urology

## 2024-11-22 DIAGNOSIS — R972 Elevated prostate specific antigen [PSA]: Secondary | ICD-10-CM

## 2024-11-23 ENCOUNTER — Encounter: Payer: Self-pay | Admitting: Urology

## 2024-12-30 ENCOUNTER — Ambulatory Visit
Admission: RE | Admit: 2024-12-30 | Discharge: 2024-12-30 | Disposition: A | Discharge status: Home / Self Care | Source: Ambulatory Visit | Attending: Urology | Admitting: Urology

## 2024-12-30 DIAGNOSIS — R972 Elevated prostate specific antigen [PSA]: Secondary | ICD-10-CM

## 2024-12-30 MED ORDER — GADOPICLENOL 0.5 MMOL/ML IV SOLN
7.5000 mL | Freq: Once | INTRAVENOUS | Status: AC | PRN
  Administered 2024-12-30: 7.5 mL via INTRAVENOUS
